# Patient Record
Sex: Female | Born: 1990 | Race: Black or African American | Hispanic: No | Marital: Single | Smoking: Never smoker
Health system: Southern US, Community
[De-identification: ages and names within clinical notes are randomized; demographics above are authoritative.]

## PROBLEM LIST (undated history)

## (undated) DIAGNOSIS — R000 Tachycardia, unspecified: Secondary | ICD-10-CM

## (undated) HISTORY — DX: Tachycardia, unspecified: R00.0

## (undated) HISTORY — PX: NO PAST SURGERIES: SHX2092

---

## 2019-11-04 ENCOUNTER — Other Ambulatory Visit: Payer: Self-pay | Admitting: Obstetrics and Gynecology

## 2019-11-04 DIAGNOSIS — Z363 Encounter for antenatal screening for malformations: Secondary | ICD-10-CM

## 2019-11-15 ENCOUNTER — Encounter: Payer: Self-pay | Admitting: *Deleted

## 2019-11-17 ENCOUNTER — Other Ambulatory Visit: Payer: Self-pay | Admitting: *Deleted

## 2019-11-17 ENCOUNTER — Ambulatory Visit: Payer: Medicaid Other | Attending: Obstetrics and Gynecology

## 2019-11-17 ENCOUNTER — Ambulatory Visit: Payer: Medicaid Other | Admitting: *Deleted

## 2019-11-17 ENCOUNTER — Other Ambulatory Visit: Payer: Self-pay

## 2019-11-17 VITALS — BP 113/67 | HR 100 | Ht 65.5 in | Wt 178.0 lb

## 2019-11-17 DIAGNOSIS — O0990 Supervision of high risk pregnancy, unspecified, unspecified trimester: Secondary | ICD-10-CM | POA: Diagnosis present

## 2019-11-17 DIAGNOSIS — O99282 Endocrine, nutritional and metabolic diseases complicating pregnancy, second trimester: Secondary | ICD-10-CM

## 2019-11-17 DIAGNOSIS — Z363 Encounter for antenatal screening for malformations: Secondary | ICD-10-CM | POA: Diagnosis present

## 2019-11-17 DIAGNOSIS — O99283 Endocrine, nutritional and metabolic diseases complicating pregnancy, third trimester: Secondary | ICD-10-CM

## 2019-11-17 DIAGNOSIS — D259 Leiomyoma of uterus, unspecified: Secondary | ICD-10-CM

## 2019-11-17 DIAGNOSIS — E0590 Thyrotoxicosis, unspecified without thyrotoxic crisis or storm: Secondary | ICD-10-CM | POA: Diagnosis not present

## 2019-11-17 DIAGNOSIS — Z3A25 25 weeks gestation of pregnancy: Secondary | ICD-10-CM

## 2019-11-17 DIAGNOSIS — O3412 Maternal care for benign tumor of corpus uteri, second trimester: Secondary | ICD-10-CM | POA: Diagnosis not present

## 2019-12-29 ENCOUNTER — Other Ambulatory Visit: Payer: Self-pay

## 2019-12-29 ENCOUNTER — Ambulatory Visit: Payer: Medicaid Other

## 2019-12-29 ENCOUNTER — Ambulatory Visit: Payer: Medicaid Other | Attending: Obstetrics and Gynecology

## 2019-12-29 DIAGNOSIS — O99283 Endocrine, nutritional and metabolic diseases complicating pregnancy, third trimester: Secondary | ICD-10-CM | POA: Insufficient documentation

## 2019-12-29 DIAGNOSIS — O3413 Maternal care for benign tumor of corpus uteri, third trimester: Secondary | ICD-10-CM

## 2019-12-29 DIAGNOSIS — Z362 Encounter for other antenatal screening follow-up: Secondary | ICD-10-CM | POA: Diagnosis not present

## 2019-12-29 DIAGNOSIS — O09293 Supervision of pregnancy with other poor reproductive or obstetric history, third trimester: Secondary | ICD-10-CM

## 2019-12-29 DIAGNOSIS — Z3A31 31 weeks gestation of pregnancy: Secondary | ICD-10-CM

## 2019-12-29 DIAGNOSIS — E0590 Thyrotoxicosis, unspecified without thyrotoxic crisis or storm: Secondary | ICD-10-CM

## 2020-10-20 IMAGING — US US MFM OB COMP +14 WKS
1 series · 13 of 28 positions shown · non-contrast
Comparison: none

[Series 1: us mfm ob comp +14 wks · 13 of 95 slices shown]
[im 4/95]
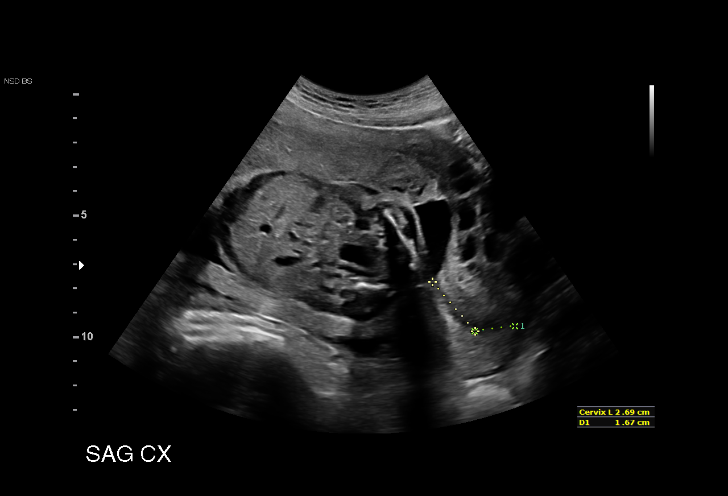
[im 11/95]
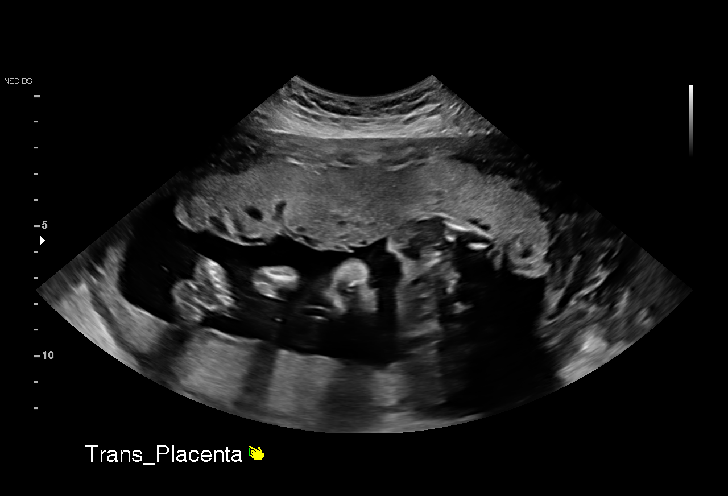
[im 18/95]
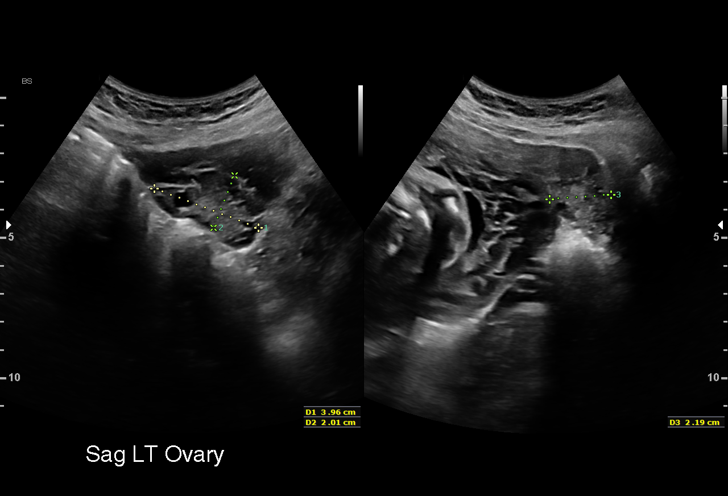
[im 25/95]
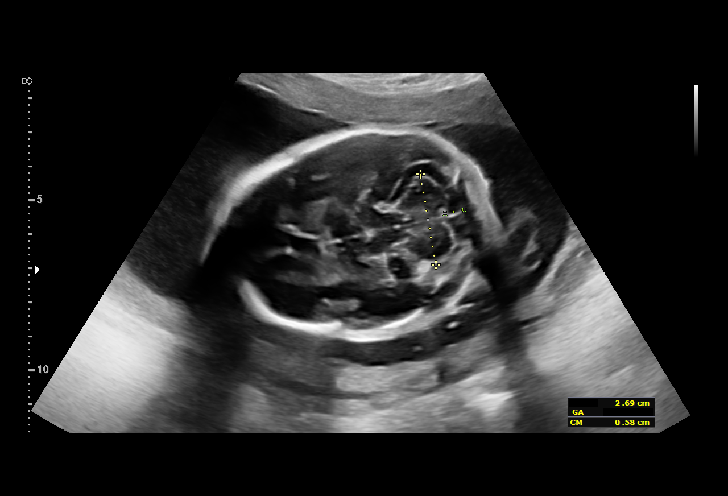
[im 32/95]
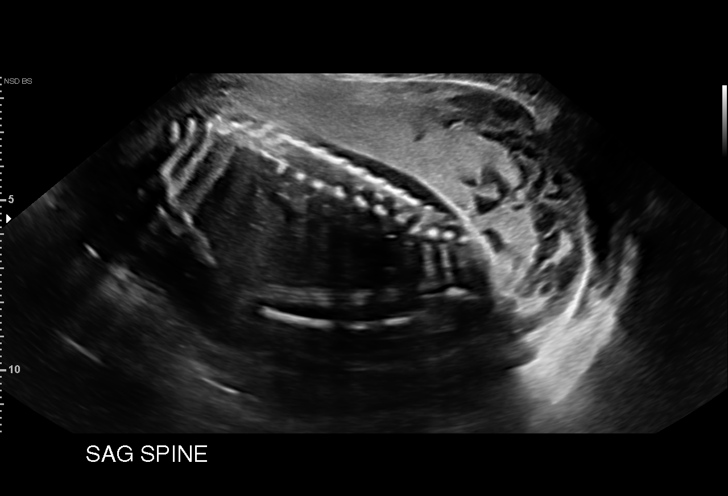
[im 39/95]
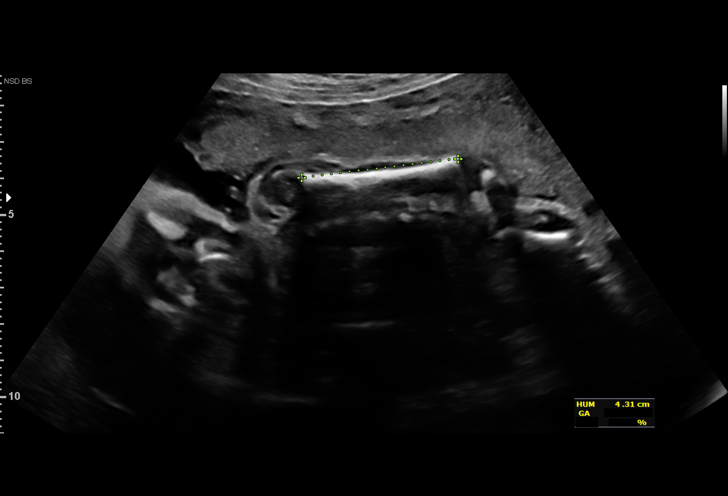
[im 49/95]
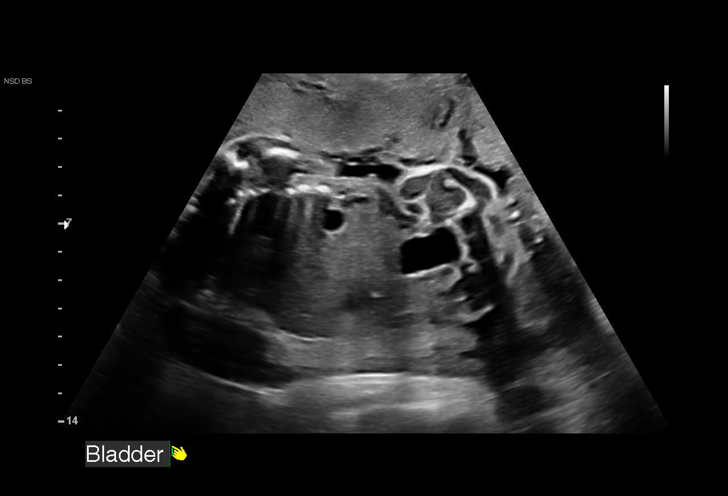
[im 56/95]
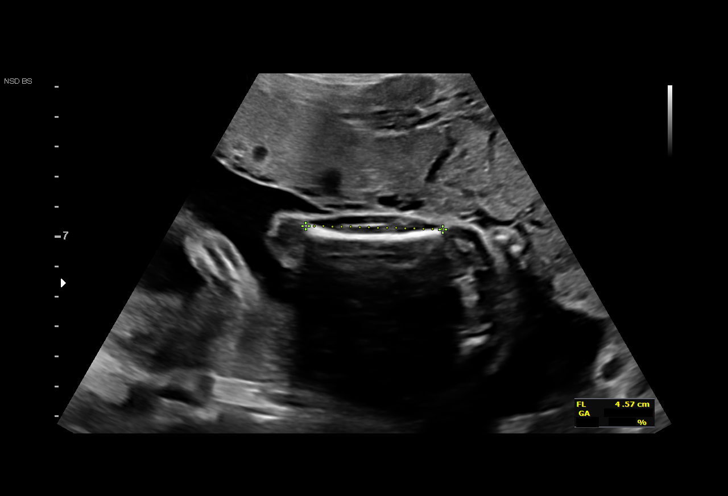
[im 63/95]
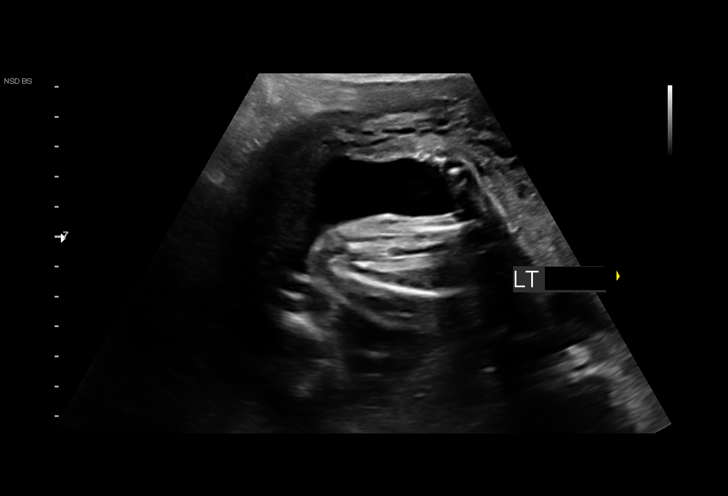
[im 70/95]
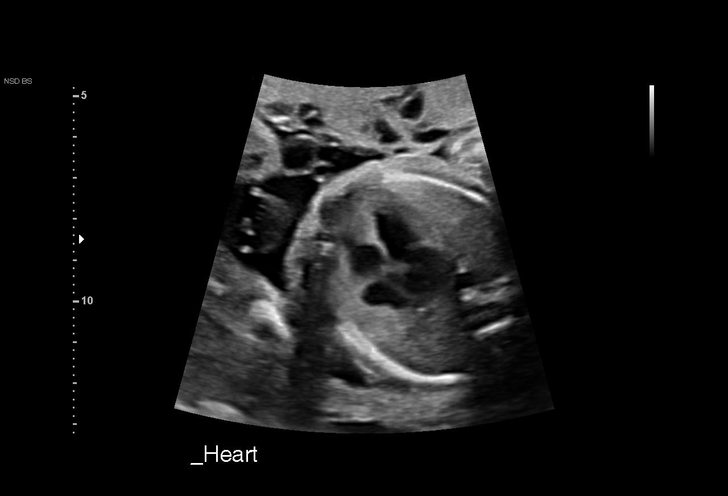
[im 77/95]
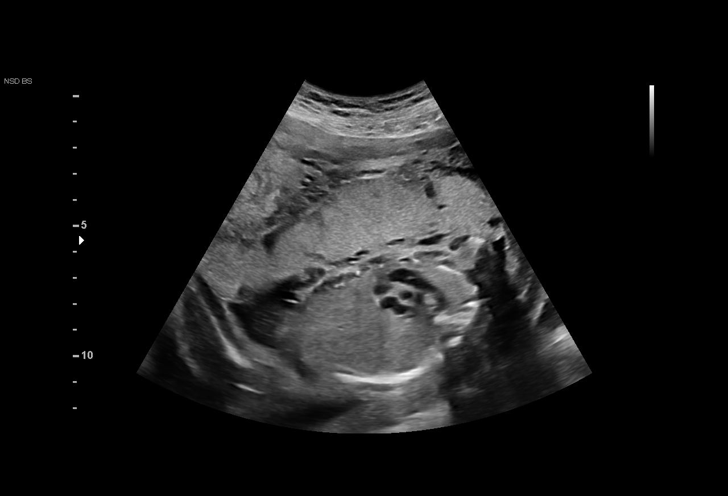
[im 84/95]
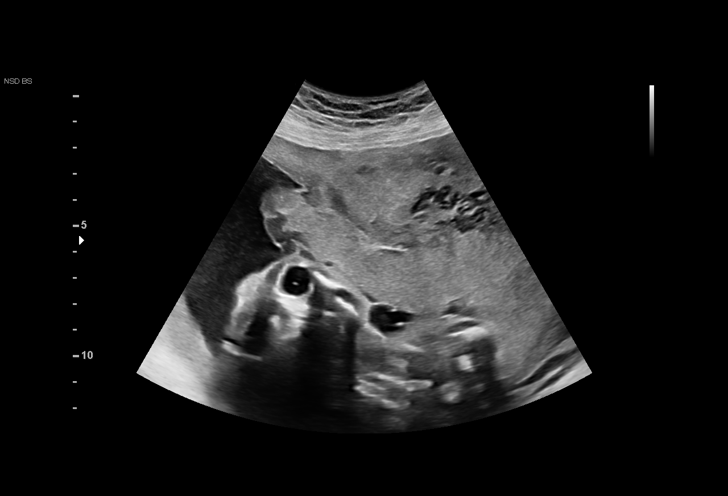
[im 91/95]
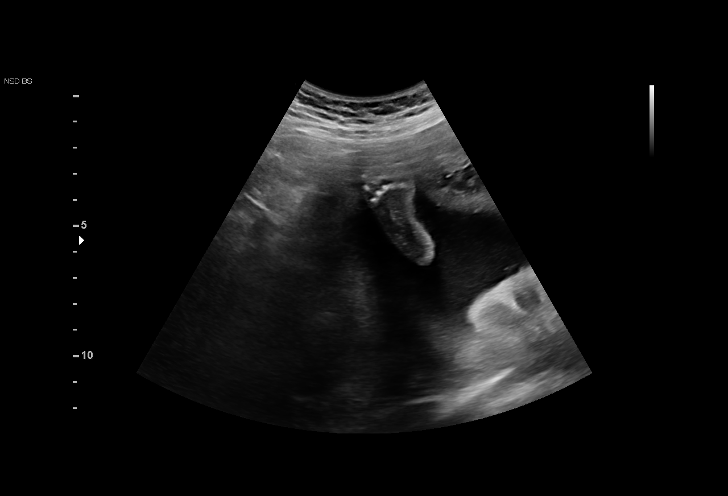

[13 of 28 positions shown; findings below may reference images not displayed]

[facility]                                        Obstetrics &
                                                              Gynecology
                                                              [id number]
                                                              [place].

 1   US MFM OB COMP + 14 WK               [address]     [address]

Indications

 Encounter for antenatal screening for
 malformations
 25 weeks gestation of pregnancy
 Hyperthyroid (No meds, reported as normal at
 this time)
 Uterine fibroid
Fetal Evaluation

 Num Of Fetuses:          1
 Fetal Heart Rate(bpm):   150
 Cardiac Activity:        Observed
 Presentation:            Breech
 Placenta:                Anterior
 P. Cord Insertion:       Visualized, central

 Amniotic Fluid
 AFI FV:      Within normal limits

                             Largest Pocket(cm)

Biometry

 BPD:      59.3   mm     G. Age:  24w 2d         17  %    CI:         66.88  %    70 - 86
                                                          FL/HC:       19.9  %    18.7 -
 HC:      232.3   mm     G. Age:  25w 2d         38  %    HC/AC:       1.17       1.04 -
 AC:      198.5   mm     G. Age:  24w 4d         26  %    FL/BPD:      78.1  %    71 - 87
 FL:       46.3   mm     G. Age:  25w 3d         49  %    FL/AC:       23.3  %    20 - 24
 HUM:      43.2   mm     G. Age:  25w 5d         64  %
 CER:      26.9   mm     G. Age:  24w 3d         34  %

 LV:         5.1  mm
 CM:         5.8  mm

 Est. FW:     746   gm    1 lb 10 oz      35  %
OB History

 Gravidity:     2         Term:  1          Prem:  0        SAB:   0
 TOP:           0       Ectopic: 0         Living: 1
Gestational Age

 LMP:            25w 0d       Date:  05/26/19                   EDD:  03/01/20
 U/S Today:      24w 6d                                         EDD:  03/02/20
 Best:           25w 0d    Det. By:  LMP  (05/26/19)            EDD:  03/01/20
Anatomy

 Cranium:                Appears normal         LVOT:                   Appears
                                                                        normalubopti
 Cavum:                  Appears normal         Aortic Arch:            Appears normal
 Ventricles:             Appears normal         Ductal Arch:            Appears normal
 Choroid Plexus:         Appears normal         Diaphragm:              Appears normal
 Cerebellum:             Appears normal         Stomach:                Appears normal, left
                                                                        sided
 Posterior Fossa:        Appears normal         Abdomen:                Appears normal
 Nuchal Fold:            Not applicable (>20    Abdominal Wall:         Appears nml (cord
                         wks GA)                                        insert, abd wall)
 Face:                   Appears normal         Cord Vessels:           Appears normal (3
                         (orbits and profile)                           vessel cord)
 Lips:                   Appears normal         Kidneys:                Appear normal
 Palate:                 Not well visualized    Bladder:                Appears normal
 Thoracic:               Appears normal         Spine:                  Appears normal
 Heart:                  Appears normal         Upper Extremities:      Visualized
                         (4CH, axis, and situs)
 RVOT:                   Appears normal         Lower Extremities:      Visualized

 Other:   Fetus appears to be female. Heels visualized. Technically difficult due to
          fetal position.
Cervix Uterus Adnexa

 Cervix
 Length:             4.4  cm.
 Normal appearance by transabdominal scan.

 Right Ovary
 Within normal limits.

 Left Ovary
 Within normal limits.
 Adnexa
 No abnormality visualized.
Impression

 Normal anatomy with measurements consistent with dates by
 LMP
 Recent transfer of care from [place]
 Normal AFP and Arin Pappalardo per notes
 Had prior placenta previa in [REDACTED] but is resolved on today's
 examination
 Suboptimal views of the fetal hands were obtained

 Suspected hyperthryoidism but euthryoid on 08/01/19
Recommendations

 Follow up growth in 4-6 weeks.

## 2020-12-01 IMAGING — US US MFM OB FOLLOW-UP
1 series · 13 of 28 positions shown · non-contrast
Comparison: none

[Series 1: us mfm ob follow-up · 13 of 88 slices shown]
[im 4/88]
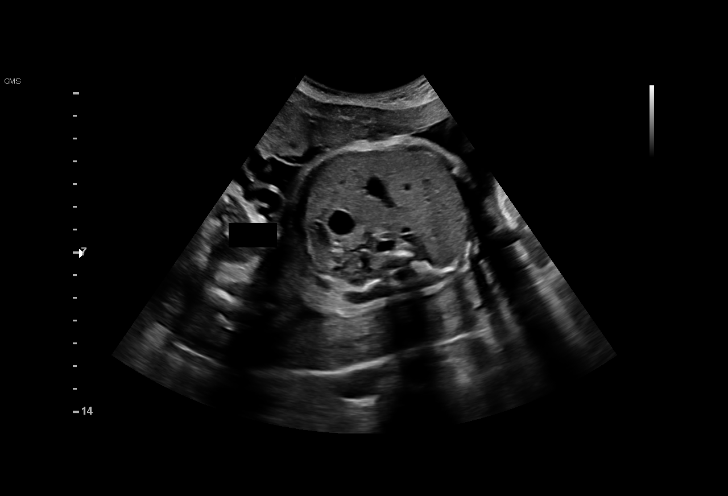
[im 10/88]
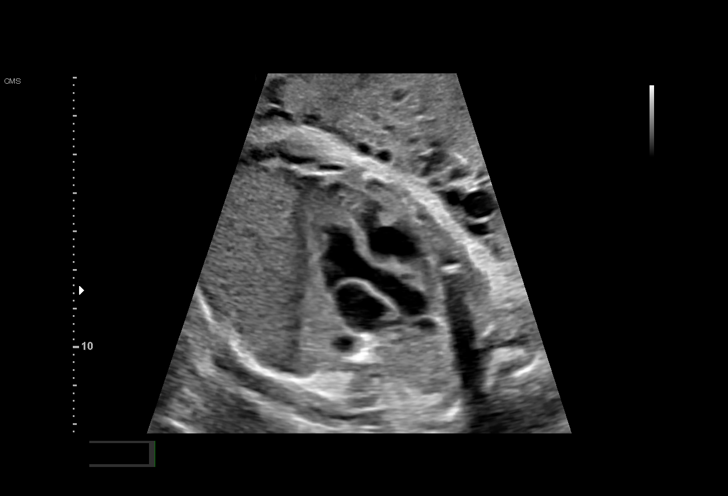
[im 17/88]
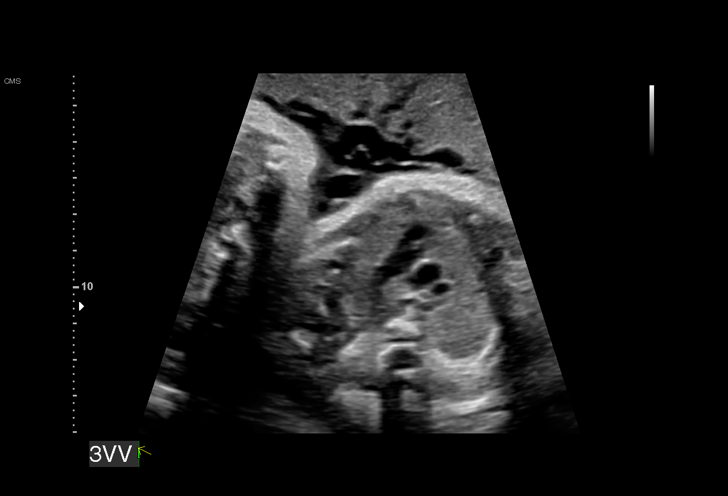
[im 23/88]
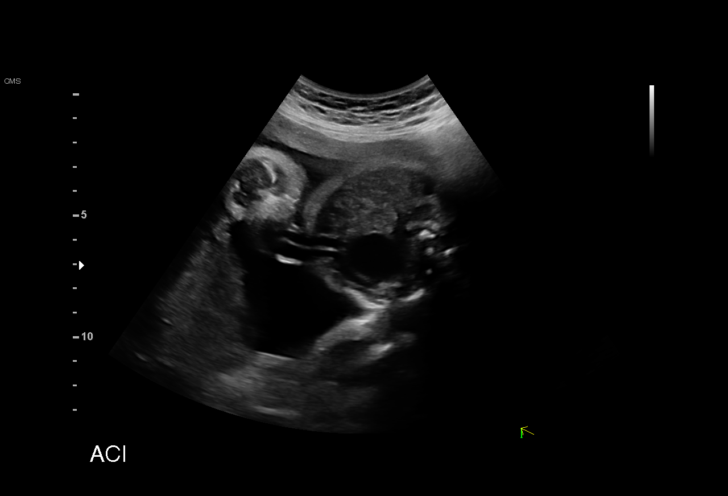
[im 30/88]
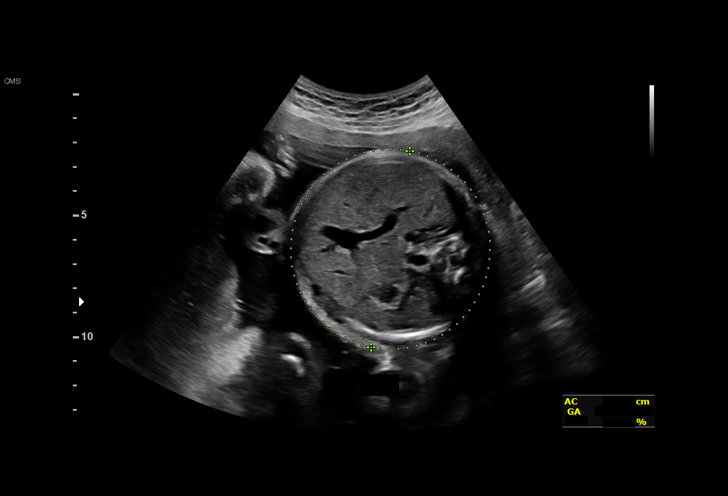
[im 36/88]
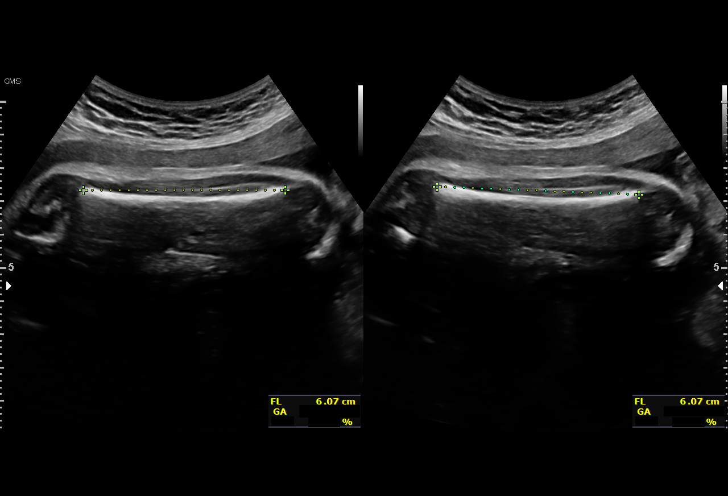
[im 46/88]
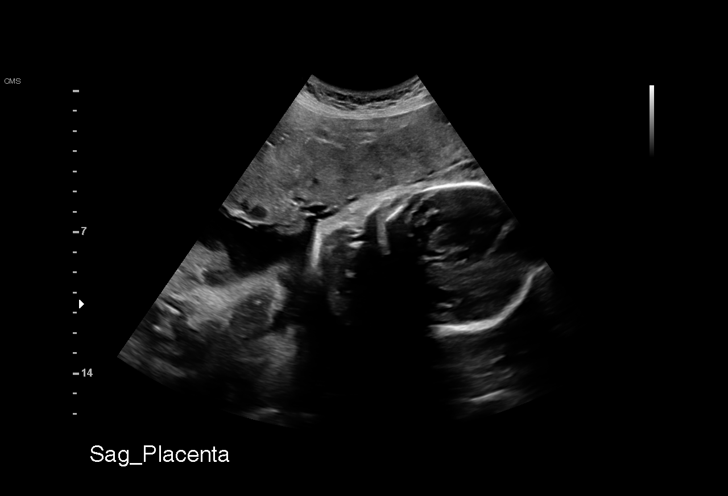
[im 52/88]
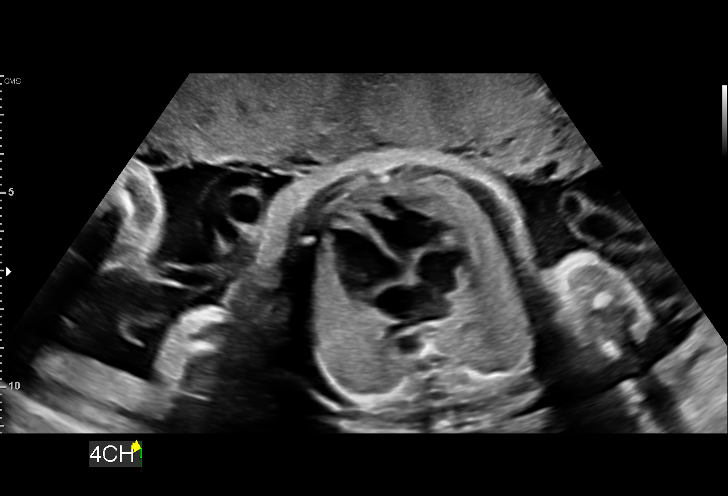
[im 59/88]
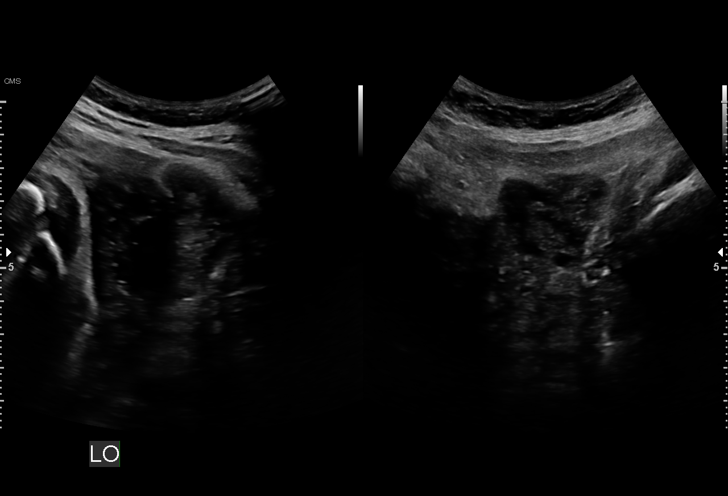
[im 65/88]
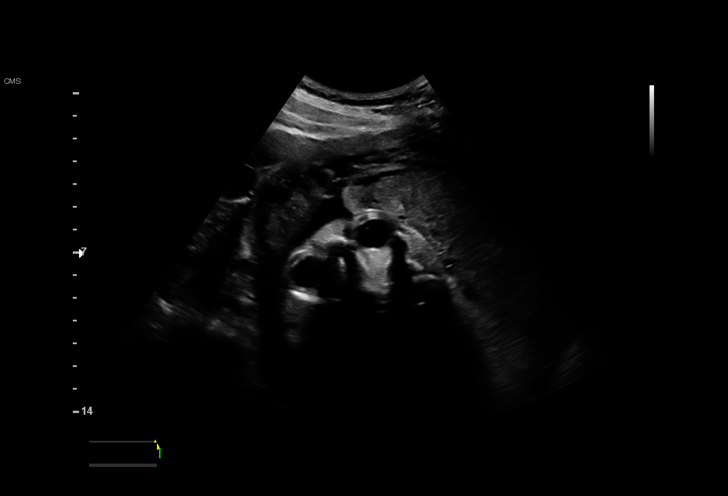
[im 71/88]
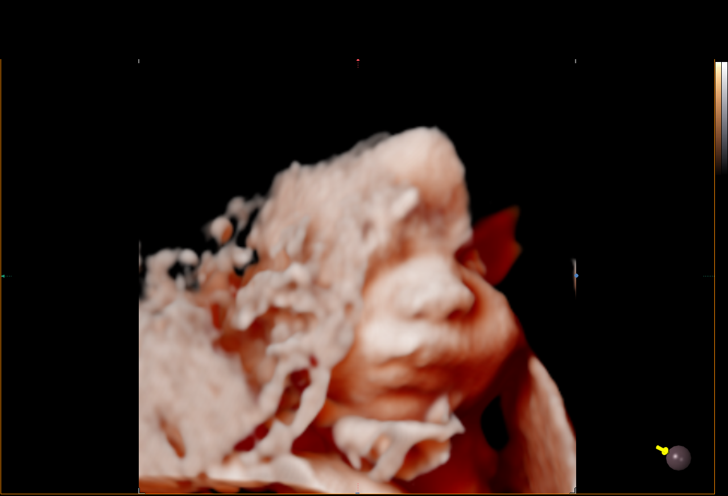
[im 78/88]
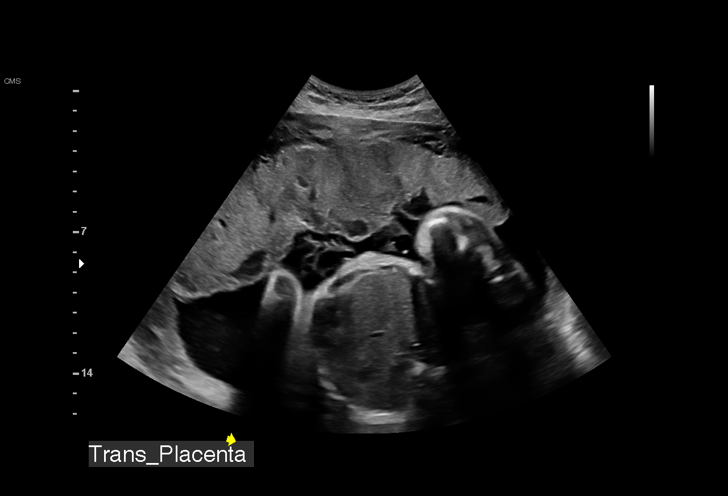
[im 84/88]
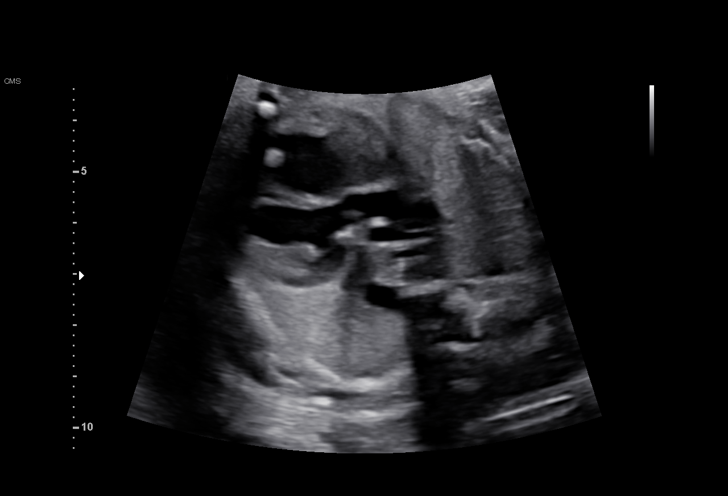

[13 of 28 positions shown; findings below may reference images not displayed]

Obstetrics &
                                                            Gynecology
                                                            [address]
                                                            [place]
                   [facility]

                                                      [place]

Indications

 Hyperthyroid (No meds, reported as normal
 at this time)
 Antenatal follow-up for nonvisualized fetal
 anatomy
 Uterine fibroid
 Poor obstetric history: Previous fetal growth
 restriction (2495g at 38 weeks)
 31 weeks gestation of pregnancy
Fetal Evaluation

 Num Of Fetuses:         1
 Fetal Heart Rate(bpm):  144
 Cardiac Activity:       Observed
 Presentation:           Cephalic
 Placenta:               Anterior
 P. Cord Insertion:      Visualized

 Amniotic Fluid
 AFI FV:      Within normal limits

 AFI Sum(cm)     %Tile       Largest Pocket(cm)
 17.1            63

 RUQ(cm)       RLQ(cm)       LUQ(cm)        LLQ(cm)

Biometry

 BPD:      73.4  mm     G. Age:  29w 3d          6  %    CI:        68.77   %    70 - 86
                                                         FL/HC:      21.4   %    19.3 -
 HC:      282.8  mm     G. Age:  31w 0d         16  %    HC/AC:      1.10        0.96 -
 AC:      258.1  mm     G. Age:  30w 0d         18  %    FL/BPD:     82.6   %    71 - 87
 FL:       60.6  mm     G. Age:  31w 4d         50  %    FL/AC:      23.5   %    20 - 24
 HUM:      56.3  mm     G. Age:  32w 5d         87  %

 Est. FW:    4502  gm      3 lb 8 oz     23  %
OB History

 Gravidity:    2         Term:   1        Prem:   0        SAB:   0
 TOP:          0       Ectopic:  0        Living: 1
Gestational Age

 LMP:           31w 0d        Date:  05/26/19                 EDD:   03/01/20
 U/S Today:     30w 4d                                        EDD:   03/04/20
 Best:          31w 0d     Det. By:  LMP  (05/26/19)          EDD:   03/01/20
Anatomy

 Cranium:               Appears normal         LVOT:                   Appears normal
 Cavum:                 Appears normal         Aortic Arch:            Appears normal
 Ventricles:            Appears normal         Ductal Arch:            Appears normal
 Choroid Plexus:        Previously seen        Diaphragm:              Previously seen
 Cerebellum:            Previously seen        Stomach:                Appears normal, left
                                                                       sided
 Posterior Fossa:       Previously seen        Abdomen:                Appears normal
 Nuchal Fold:           Not applicable (>20    Abdominal Wall:         Appears nml (cord
                        wks GA)                                        insert, abd wall)
 Face:                  Appears normal         Cord Vessels:           Appears normal (3
                        (orbits and profile)                           vessel cord)
 Lips:                  Appears normal         Kidneys:                Appear normal
 Palate:                Not well visualized    Bladder:                Appears normal
 Thoracic:              Appears normal         Spine:                  Previously seen
 Heart:                 Appears normal         Upper Extremities:      Previously seen
                        (4CH, axis, and
                        situs)
 RVOT:                  Appears normal         Lower Extremities:      Previously seen

 Other:  Fetus appears to be female. Heels previously visualized. Technically
         difficult due to fetal position.
Cervix Uterus Adnexa

 Cervix
 Not visualized (advanced GA >02wks)

 Uterus
 Single fibroid noted, see table below.

 Right Ovary
 Within normal limits.

 Left Ovary
 Within normal limits.
 Cul De Sac
 No free fluid seen.

 Adnexa
 No abnormality visualized.
Myomas

 Site                     L(cm)      W(cm)      D(cm)       Location
 Anterior

 Blood Flow                  RI       PI       Comments

Comments

 This patient was seen for a follow up growth scan due to due
 to a history of hyperthyroidism that is not currently treated
 with any medications.  The fetal hands were suboptimally
 visualized during her last ultrasound exam.  She denies any
 problems since her last exam.
 She was informed that the fetal growth and amniotic fluid
 level appears appropriate for her gestational age.
 The fetal hands were visualized today.
 Follow-up as indicated.
